# Patient Record
Sex: Male | Born: 1995 | Race: Black or African American | Hispanic: No | Marital: Single | Smoking: Current some day smoker
Health system: Southern US, Community
[De-identification: ages and names within clinical notes are randomized; demographics above are authoritative.]

## PROBLEM LIST (undated history)

## (undated) DIAGNOSIS — J45909 Unspecified asthma, uncomplicated: Secondary | ICD-10-CM

---
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
## 2013-01-28 IMAGING — CR DG CHEST 1V PORT
1 series · 1 of 1 positions shown · non-contrast
Comparison: None.

CLINICAL DATA: Shortness of breath.

PORTABLE CHEST - 1 VIEW

[view not recorded]
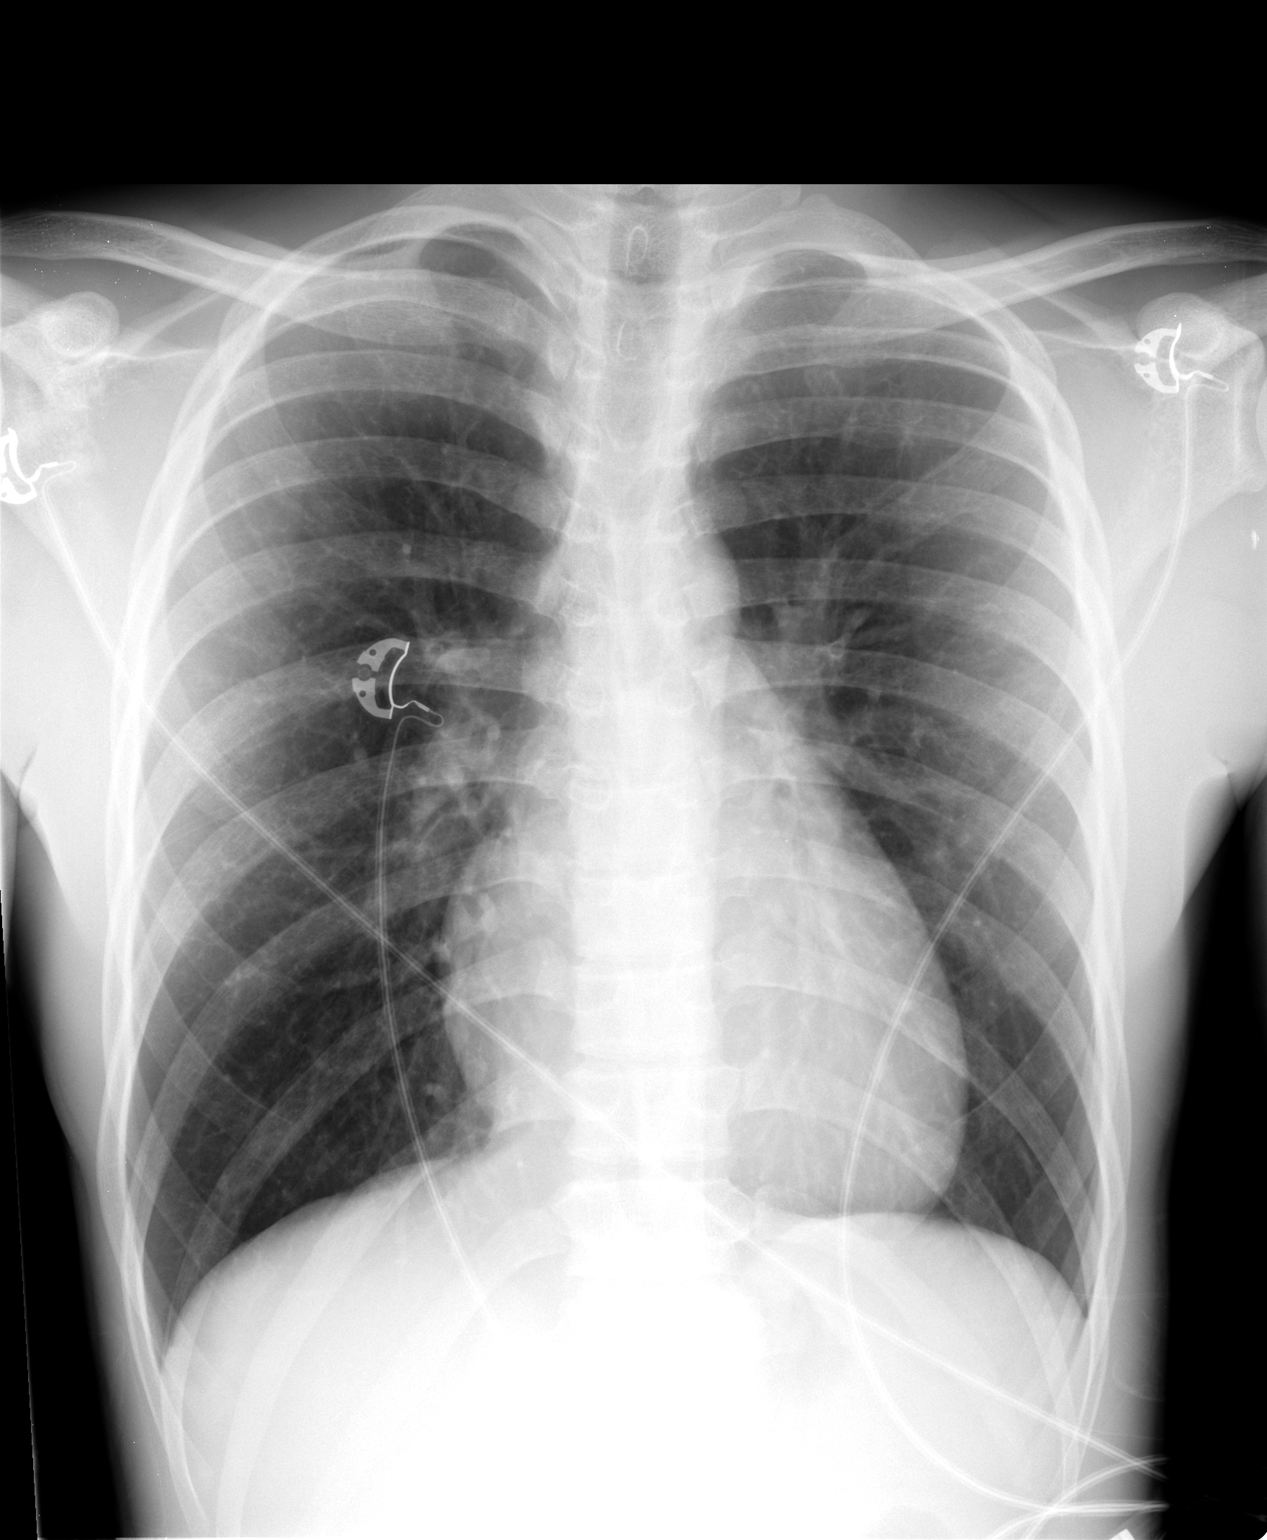

[1 of 1 positions shown; findings below may reference images not displayed]

FINDINGS: Lungs are clear. No pleural effusion or pneumothorax. The
cardiomediastinal contours are within normal limits. The visualized
bones and soft tissues are without significant appreciable
abnormality.
IMPRESSION: No acute cardiopulmonary process.

## 2014-06-17 IMAGING — CR DG CHEST 2V
2 series · 2 of 2 positions shown · non-contrast
Comparison: May 14, 2011

CLINICAL DATA: Cough and fever

CHEST - 2 VIEW

[w chest pa]
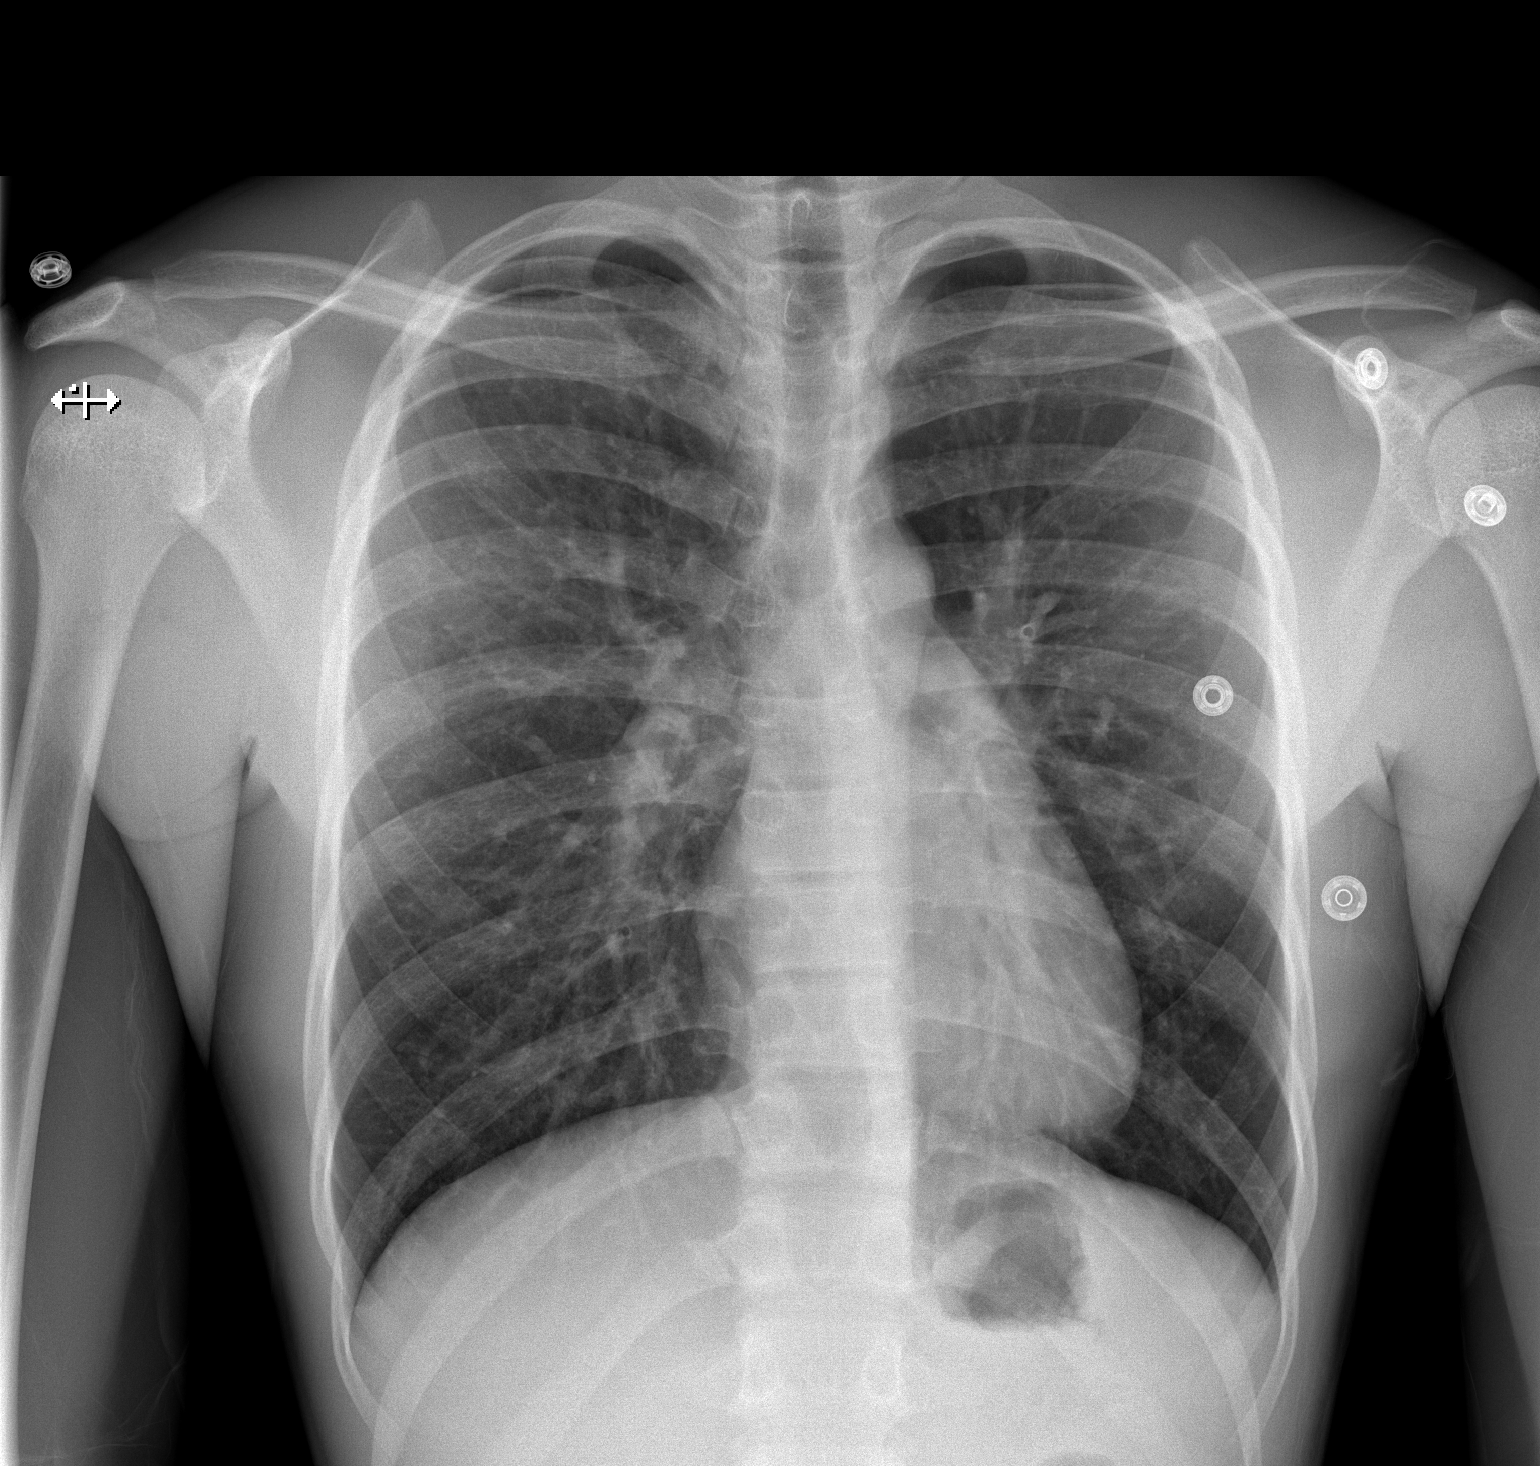

[w chest lat]
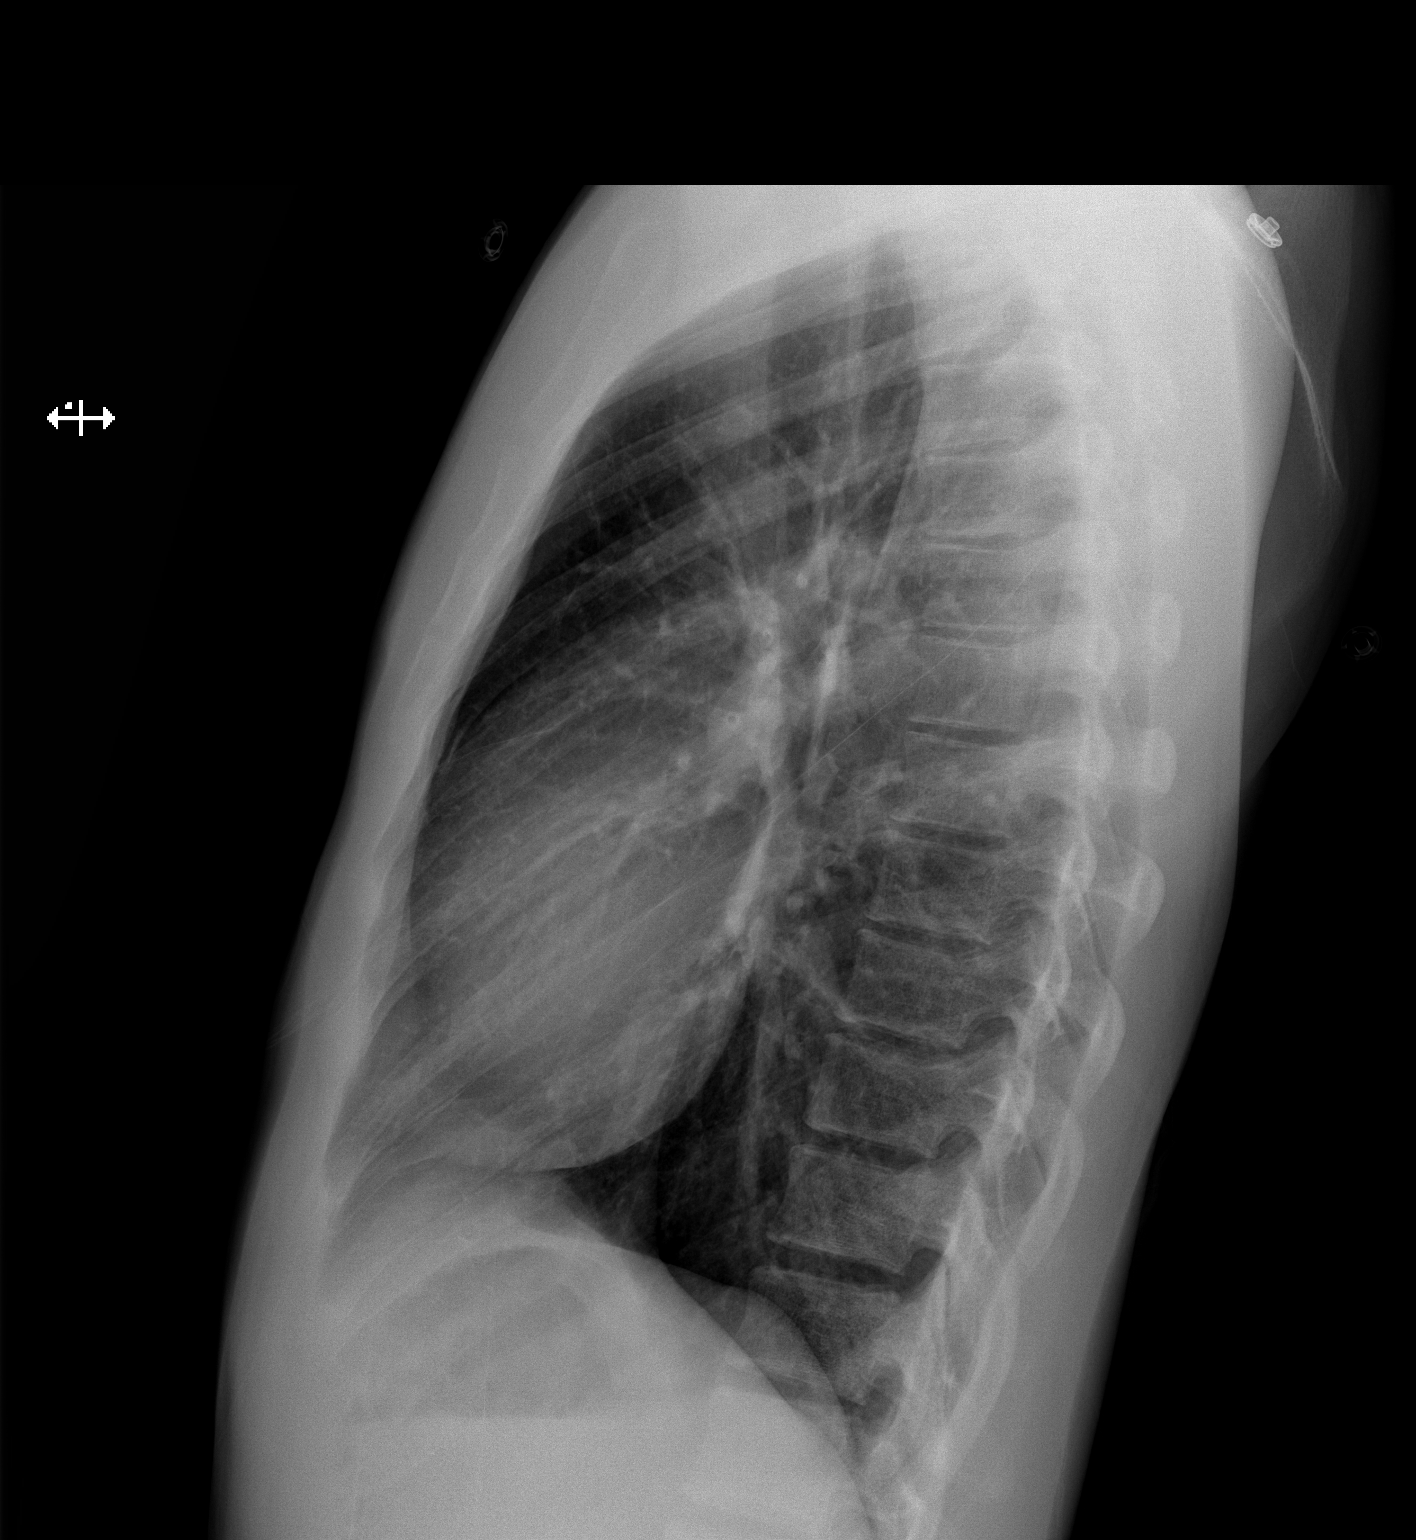

[2 of 2 positions shown; findings below may reference images not displayed]

FINDINGS: Lungs clear.  Heart size and pulmonary vascularity are
normal.  No adenopathy.  No bone lesions.
IMPRESSION: No abnormality noted.

## 2016-03-29 ENCOUNTER — Emergency Department (HOSPITAL_COMMUNITY)
Admission: EM | Admit: 2016-03-29 | Discharge: 2016-03-29 | Disposition: A | Discharge status: Home / Self Care | Payer: Self-pay | Attending: Emergency Medicine | Admitting: Emergency Medicine

## 2016-03-29 ENCOUNTER — Encounter (HOSPITAL_COMMUNITY): Payer: Self-pay | Admitting: Emergency Medicine

## 2016-03-29 ENCOUNTER — Emergency Department (HOSPITAL_COMMUNITY): Payer: Self-pay

## 2016-03-29 DIAGNOSIS — Z791 Long term (current) use of non-steroidal anti-inflammatories (NSAID): Secondary | ICD-10-CM | POA: Insufficient documentation

## 2016-03-29 DIAGNOSIS — L0291 Cutaneous abscess, unspecified: Secondary | ICD-10-CM

## 2016-03-29 DIAGNOSIS — J45909 Unspecified asthma, uncomplicated: Secondary | ICD-10-CM | POA: Insufficient documentation

## 2016-03-29 DIAGNOSIS — L02612 Cutaneous abscess of left foot: Secondary | ICD-10-CM | POA: Insufficient documentation

## 2016-03-29 DIAGNOSIS — Y929 Unspecified place or not applicable: Secondary | ICD-10-CM | POA: Insufficient documentation

## 2016-03-29 DIAGNOSIS — F17200 Nicotine dependence, unspecified, uncomplicated: Secondary | ICD-10-CM | POA: Insufficient documentation

## 2016-03-29 DIAGNOSIS — X58XXXA Exposure to other specified factors, initial encounter: Secondary | ICD-10-CM | POA: Insufficient documentation

## 2016-03-29 DIAGNOSIS — Y999 Unspecified external cause status: Secondary | ICD-10-CM | POA: Insufficient documentation

## 2016-03-29 DIAGNOSIS — Z79899 Other long term (current) drug therapy: Secondary | ICD-10-CM | POA: Insufficient documentation

## 2016-03-29 DIAGNOSIS — Y9367 Activity, basketball: Secondary | ICD-10-CM | POA: Insufficient documentation

## 2016-03-29 MED ORDER — SULFAMETHOXAZOLE-TRIMETHOPRIM 800-160 MG PO TABS
1.0000 | ORAL_TABLET | Freq: Once | ORAL | Status: AC
  Administered 2016-03-29: 1 via ORAL
  Filled 2016-03-29: qty 1

## 2016-03-29 MED ORDER — LIDOCAINE HCL (PF) 1 % IJ SOLN
2.0000 mL | Freq: Once | INTRAMUSCULAR | Status: AC
  Administered 2016-03-29: 2 mL
  Filled 2016-03-29: qty 30

## 2016-03-29 MED ORDER — SULFAMETHOXAZOLE-TRIMETHOPRIM 800-160 MG PO TABS: 1.0000 | ORAL_TABLET | Freq: Two times a day (BID) | ORAL | Status: AC

## 2016-03-29 MED ORDER — TRAMADOL HCL 50 MG PO TABS: 50.0000 mg | ORAL_TABLET | Freq: Four times a day (QID) | ORAL | Status: AC | PRN

## 2016-03-29 MED ORDER — HYDROCODONE-ACETAMINOPHEN 5-325 MG PO TABS
1.0000 | ORAL_TABLET | Freq: Once | ORAL | Status: AC
  Administered 2016-03-29: 1 via ORAL
  Filled 2016-03-29: qty 1

## 2016-03-29 NOTE — ED Provider Notes (Signed)
CSN: [id number]     Arrival date & time 03/29/16  0021 History   First MD Initiated Contact with Patient 03/29/16 0251     Chief Complaint  Patient presents with  . Foot Injury     (Consider location/radiation/quality/duration/timing/severity/associated sxs/prior Treatment) HPI Comments: This normally healthy 20 year old male who states he was playing basketball 3 days ago after which he noticed that he had some discomfort in his left great toe since that time he has had increased redness and swelling to the toe joint itself with discoloration to the sole or bottom surface.  Denies any fever, drainage from the area, pain in the ankle or midfoot  Patient is a 20 y.o. male presenting with foot injury.  Foot Injury Location:  Toe Time since incident:  3 days Injury: no   Toe location:  L big toe Pain details:    Quality:  Aching   Radiates to:  Does not radiate   Severity:  Moderate   Onset quality:  Gradual   Duration:  3 days   Timing:  Constant   Progression:  Worsening Chronicity:  New Dislocation: no   Foreign body present:  No foreign bodies Tetanus status:  Unknown Prior injury to area:  No Relieved by:  None tried Worsened by:  Activity Ineffective treatments:  None tried Associated symptoms: no fever     Past Medical History  Diagnosis Date  . Asthma    History reviewed. No pertinent past surgical history. Family History  Problem Relation Age of Onset  . Hypertension Mother    Social History  Substance Use Topics  . Smoking status: Current Some Day Smoker  . Smokeless tobacco: None  . Alcohol Use: Yes     Comment: rare    Review of Systems  Constitutional: Negative for fever and chills.  Musculoskeletal: Positive for joint swelling.  Skin: Negative for wound.  Neurological: Negative for weakness and numbness.  All other systems reviewed and are negative.     Allergies  Ceclor and Shellfish allergy  Home Medications   Prior to Admission  medications   Medication Sig Start Date End Date Taking? Authorizing Provider  albuterol (PROVENTIL HFA;VENTOLIN HFA) 108 (90 BASE) MCG/ACT inhaler Inhale 2 puffs into the lungs every 6 (six) hours as needed.   Yes Historical Provider, MD  ibuprofen (ADVIL,MOTRIN) 200 MG tablet Take 200-400 mg by mouth every 6 (six) hours as needed for headache, mild pain or moderate pain.    Yes Historical Provider, MD  sulfamethoxazole-trimethoprim (BACTRIM DS,SEPTRA DS) 800-160 MG tablet Take 1 tablet by mouth 2 (two) times daily. 03/29/16 04/05/16  Earley Favor, NP  traMADol (ULTRAM) 50 MG tablet Take 1 tablet (50 mg total) by mouth every 6 (six) hours as needed. 03/29/16   Earley Favor, NP   BP 108/97 mmHg  Pulse 91  Temp(Src) 98.6 F (37 C) (Oral)  Resp 19  SpO2 100% Physical Exam  Constitutional: He is oriented to person, place, and time. He appears well-developed and well-nourished.  HENT:  Head: Normocephalic.  Eyes: Pupils are equal, round, and reactive to light.  Neck: Normal range of motion.  Cardiovascular: Normal rate and regular rhythm.   Pulmonary/Chest: Effort normal and breath sounds normal.  Musculoskeletal: Normal range of motion. He exhibits tenderness.       Feet:  Neurological: He is alert and oriented to person, place, and time.  Skin: Skin is warm. There is erythema.  Nursing note and vitals reviewed.   ED Course  Procedures (  including critical care time) Labs Review Labs Reviewed - No data to display  Imaging Review Dg Toe Great Left  03/29/2016  CLINICAL DATA:  20 year old male with injury to the left great toe. EXAM: LEFT GREAT TOE COMPARISON:  Set FINDINGS: There is no acute fracture or dislocation. The bones are well mineralized. No arthritic changes. Mild soft tissue swelling of the toe. No radiopaque foreign object. IMPRESSION: No acute osseous pathology. Electronically Signed   By: Elgie CollardArash  Radparvar M.D.   On: 03/29/2016 01:50   I have personally reviewed and evaluated  these images and lab results as part of my medical decision-making.   EKG Interpretation None      MDM   Final diagnoses:  Abscess         Earley FavorGail Chibuike Fleek, NP 03/29/16 [id number]  Jerelyn ScottMartha Linker, MD 03/29/16 [phone]

## 2016-03-29 NOTE — ED Notes (Signed)
Pt states he thinks he hurt his left foot playing basketball  Pt has redness noted to his great toe on the left foot  Pt states he has been applying ice to it for the past 2 days

## 2016-03-29 NOTE — Discharge Instructions (Signed)
Your abscess was opened and drained today.  It was left open.  Please soaking her foot in a warm saltwater bath 3-4 times a day for the next 2 days .  This will assist in any further drainage .  It is perfectly safe for you to gently milk the area for any exudates .  Keep the area covered  Wash with soap and water daily  You have been given a prescription for an antibiotic.  Please take this as directed until all tablets have been completed .  If you see that there is no improvement in your abscess that there is worsening swelling you develop fever or body aches.  Please return for further evaluation   Abscess An abscess (boil or furuncle) is an infected area on or under the skin. This area is filled with yellowish-white fluid (pus) and other material (debris). HOME CARE   Only take medicines as told by your doctor.  If you were given antibiotic medicine, take it as directed. Finish the medicine even if you start to feel better.  If gauze is used, follow your doctor's directions for changing the gauze.  To avoid spreading the infection:  Keep your abscess covered with a bandage.  Wash your hands well.  Do not share personal care items, towels, or whirlpools with others.  Avoid skin contact with others.  Keep your skin and clothes clean around the abscess.  Keep all doctor visits as told. GET HELP RIGHT AWAY IF:   You have more pain, puffiness (swelling), or redness in the wound site.  You have more fluid or blood coming from the wound site.  You have muscle aches, chills, or you feel sick.  You have a fever. MAKE SURE YOU:   Understand these instructions.  Will watch your condition.  Will get help right away if you are not doing well or get worse.   This information is not intended to replace advice given to you by your health care provider. Make sure you discuss any questions you have with your health care provider.   Document Released: 02/19/2008 Document Revised:  03/03/2012 Document Reviewed: 11/16/2011 Elsevier Interactive Patient Education 2016 [employer]  Incision and Drainage Incision and drainage is a procedure in which a sac-like structure (cystic structure) is opened and drained. The area to be drained usually contains material such as pus, fluid, or blood.  LET YOUR CAREGIVER KNOW ABOUT:   Allergies to medicine.  Medicines taken, including vitamins, herbs, eyedrops, over-the-counter medicines, and creams.  Use of steroids (by mouth or creams).  Previous problems with anesthetics or numbing medicines.  History of bleeding problems or blood clots.  Previous surgery.  Other health problems, including diabetes and kidney problems.  Possibility of pregnancy, if this applies. RISKS AND COMPLICATIONS  Pain.  Bleeding.  Scarring.  Infection. BEFORE THE PROCEDURE  You may need to have an ultrasound or other imaging tests to see how large or deep your cystic structure is. Blood tests may also be used to determine if you have an infection or how severe the infection is. You may need to have a tetanus shot. PROCEDURE  The affected area is cleaned with a cleaning fluid. The cyst area will then be numbed with a medicine (local anesthetic). A small incision will be made in the cystic structure. A syringe or catheter may be used to drain the contents of the cystic structure, or the contents may be squeezed out. The area will then be flushed with a  cleansing solution. After cleansing the area, it is often gently packed with a gauze or another wound dressing. Once it is packed, it will be covered with gauze and tape or some other type of wound dressing. AFTER THE PROCEDURE   Often, you will be allowed to go home right after the procedure.  You may be given antibiotic medicine to prevent or heal an infection.  If the area was packed with gauze or some other wound dressing, you will likely need to come back in 1 to 2 days to get it  removed.  The area should heal in about 14 days.   This information is not intended to replace advice given to you by your health care provider. Make sure you discuss any questions you have with your health care provider.   Document Released: 02/26/2001 Document Revised: 03/03/2012 Document Reviewed: 10/28/2011 Elsevier Interactive Patient Education [employer]
